# Patient Record
Sex: Female | Born: 1960 | Race: White | Hispanic: No | Marital: Married | Smoking: Never smoker
Health system: Southern US, Community
[De-identification: ages and names within clinical notes are randomized; demographics above are authoritative.]

## PROBLEM LIST (undated history)

## (undated) DIAGNOSIS — G610 Guillain-Barre syndrome: Secondary | ICD-10-CM

## (undated) DIAGNOSIS — N920 Excessive and frequent menstruation with regular cycle: Secondary | ICD-10-CM

## (undated) DIAGNOSIS — C801 Malignant (primary) neoplasm, unspecified: Secondary | ICD-10-CM

## (undated) HISTORY — PX: TUBAL LIGATION: SHX77

## (undated) HISTORY — PX: BASAL CELL CARCINOMA EXCISION: SHX1214

---

## 1998-12-10 ENCOUNTER — Other Ambulatory Visit: Admission: RE | Admit: 1998-12-10 | Discharge: 1998-12-10 | Payer: Self-pay | Admitting: Gynecology

## 1998-12-25 ENCOUNTER — Ambulatory Visit (HOSPITAL_COMMUNITY): Admission: RE | Admit: 1998-12-25 | Discharge: 1998-12-25 | Payer: Self-pay | Admitting: Gynecology

## 1999-08-30 ENCOUNTER — Emergency Department (HOSPITAL_COMMUNITY): Admission: EM | Admit: 1999-08-30 | Discharge: 1999-08-31 | Payer: Self-pay | Admitting: Emergency Medicine

## 2002-06-17 ENCOUNTER — Other Ambulatory Visit: Admission: RE | Admit: 2002-06-17 | Discharge: 2002-06-17 | Payer: Self-pay | Admitting: Gynecology

## 2002-06-25 ENCOUNTER — Encounter: Admission: RE | Admit: 2002-06-25 | Discharge: 2002-06-25 | Payer: Self-pay | Admitting: Gynecology

## 2002-06-25 ENCOUNTER — Encounter: Payer: Self-pay | Admitting: Gynecology

## 2003-06-30 ENCOUNTER — Other Ambulatory Visit: Admission: RE | Admit: 2003-06-30 | Discharge: 2003-06-30 | Payer: Self-pay | Admitting: Gynecology

## 2003-06-30 ENCOUNTER — Encounter: Admission: RE | Admit: 2003-06-30 | Discharge: 2003-06-30 | Payer: Self-pay | Admitting: Gynecology

## 2004-06-30 ENCOUNTER — Encounter: Admission: RE | Admit: 2004-06-30 | Discharge: 2004-06-30 | Payer: Self-pay | Admitting: Gynecology

## 2005-07-04 ENCOUNTER — Encounter: Admission: RE | Admit: 2005-07-04 | Discharge: 2005-07-04 | Payer: Self-pay | Admitting: Gynecology

## 2006-07-05 ENCOUNTER — Encounter: Admission: RE | Admit: 2006-07-05 | Discharge: 2006-07-05 | Payer: Self-pay | Admitting: Gynecology

## 2007-07-09 ENCOUNTER — Encounter: Admission: RE | Admit: 2007-07-09 | Discharge: 2007-07-09 | Payer: Self-pay | Admitting: Family Medicine

## 2008-07-09 ENCOUNTER — Encounter: Admission: RE | Admit: 2008-07-09 | Discharge: 2008-07-09 | Payer: Self-pay | Admitting: Family Medicine

## 2009-07-15 ENCOUNTER — Encounter: Admission: RE | Admit: 2009-07-15 | Discharge: 2009-07-15 | Payer: Self-pay | Admitting: Internal Medicine

## 2010-06-25 ENCOUNTER — Other Ambulatory Visit: Payer: Self-pay | Admitting: Family Medicine

## 2010-06-25 DIAGNOSIS — Z1231 Encounter for screening mammogram for malignant neoplasm of breast: Secondary | ICD-10-CM

## 2010-08-30 ENCOUNTER — Ambulatory Visit: Payer: Self-pay

## 2010-09-09 ENCOUNTER — Ambulatory Visit
Admission: RE | Admit: 2010-09-09 | Discharge: 2010-09-09 | Disposition: A | Discharge status: Home / Self Care | Payer: BC Managed Care – PPO | Source: Ambulatory Visit | Attending: Family Medicine | Admitting: Family Medicine

## 2010-09-09 DIAGNOSIS — Z1231 Encounter for screening mammogram for malignant neoplasm of breast: Secondary | ICD-10-CM

## 2011-01-25 ENCOUNTER — Other Ambulatory Visit: Payer: Self-pay | Admitting: Obstetrics and Gynecology

## 2011-02-03 ENCOUNTER — Encounter (HOSPITAL_COMMUNITY)
Admission: RE | Admit: 2011-02-03 | Discharge: 2011-02-03 | Disposition: A | Discharge status: Home / Self Care | Payer: BC Managed Care – PPO | Source: Ambulatory Visit | Attending: Obstetrics and Gynecology | Admitting: Obstetrics and Gynecology

## 2011-02-03 ENCOUNTER — Encounter (HOSPITAL_COMMUNITY): Payer: Self-pay

## 2011-02-03 HISTORY — DX: Malignant (primary) neoplasm, unspecified: C80.1

## 2011-02-03 LAB — CBC
Hemoglobin: 12.9 g/dL (ref 12.0–15.0)
MCH: 26 pg (ref 26.0–34.0)
MCHC: 33.2 g/dL (ref 30.0–36.0)
RBC: 4.96 MIL/uL (ref 3.87–5.11)

## 2011-02-03 LAB — BASIC METABOLIC PANEL
CO2: 30 mEq/L (ref 19–32)
Potassium: 4.4 mEq/L (ref 3.5–5.1)
Sodium: 137 mEq/L (ref 135–145)

## 2011-02-03 LAB — SURGICAL PCR SCREEN
MRSA, PCR: NEGATIVE
Staphylococcus aureus: NEGATIVE

## 2011-02-03 NOTE — Patient Instructions (Signed)
ELIABETH SHOFF  02/03/2011   Your procedure is scheduled on:  02/09/11  Report to [facility] at 0600 AM.  Call this number if you have problems the morning of surgery: [phone]  On the day of surgery, once you arrive to [facility] call ext. [phone] to inform us of your arrival.    Remember:   Do not eat food:After Midnight.  Do not drink clear liquids: After Midnight.  Take these medicines the morning of surgery with A SIP OF WATER: none   Do not wear jewelry, make-up or nail polish.  Do not bring valuables to the hospital.  Contacts, dentures or bridgework may not be worn into surgery.  Leave suitcase in the car. After surgery it may be brought to your room.  For patients admitted to the hospital, checkout time is 11:00 AM the day of discharge.   Patients discharged the day of surgery will not be allowed to drive home.  Name and phone number of your driver: Kathlene November- [phone]  Special Instructions:Hibiclens showers  Please read over the following fact sheets that you were given:none

## 2011-02-08 NOTE — H&P (Signed)
Pamela White is an 50 y.o. female. With menorrhagia and perimenopausal symptoms for definitive management.  D/W pt R/B/A of LAVH wishes to proceed  POBGYN HX G1P1 - LTCS 9#2, arrest of dilitation No abnormal pap No h/o STDs Menorrhagia, nl EMB, perimenopausal sx;s    Past Medical History  Diagnosis Date  . Cancer     basal cell removed from face    Past Surgical History  Procedure Date  . Tubal ligation   . Basal cell carcinoma excision   cesarean section Tonsillectomy  FH CAD, CVA, DM2, Ewings sarcoms, hypercholesterolemis, MI  Social History:  reports that she has never smoked. She does not have any smokeless tobacco history on file. She reports that she drinks alcohol. She reports that she does not use illicit drugs. married  Allergies: No Known Allergies  Meds: Aleve, B12, MVI    Review of Systems  Constitutional: Negative.   HENT: Negative.   Eyes: Negative.   Respiratory: Negative.   Cardiovascular: Negative.   Gastrointestinal: Negative.   Genitourinary: Negative.   Musculoskeletal: Negative.   Neurological: Negative.   Psychiatric/Behavioral: Negative.     AF VSS Physical Exam  Constitutional: She is oriented to person, place, and time. She appears well-developed and well-nourished.  HENT:  Head: Normocephalic and atraumatic.  Neck: Normal range of motion. Neck supple.  Cardiovascular: Normal rate and regular rhythm.   Respiratory: Effort normal and breath sounds normal.  GI: Soft. Bowel sounds are normal. There is no tenderness.  Musculoskeletal: Normal range of motion.  Neurological: She is alert and oriented to person, place, and time.  Psychiatric: She has a normal mood and affect.    Assessment/Plan: 50yo G1P1 with menorrhagia desires definitive management.  Had considered Novasure Endometrial Ablation.  Have discussed with pt R/B/A of LAVH including risk of bleeding, infection, damage to surrounding tissue. She wishes to proceed.  Surgery  scheduled for 02/09/11.  BOVARD,Alexzia Kasler 02/08/2011, 12:31 PM

## 2011-02-09 ENCOUNTER — Other Ambulatory Visit: Payer: Self-pay | Admitting: Obstetrics and Gynecology

## 2011-02-09 ENCOUNTER — Encounter (HOSPITAL_COMMUNITY)
Admission: RE | Disposition: A | Discharge status: Hospice | Payer: Self-pay | Source: Ambulatory Visit | Attending: Obstetrics and Gynecology

## 2011-02-09 ENCOUNTER — Encounter (HOSPITAL_COMMUNITY): Payer: Self-pay | Admitting: Obstetrics and Gynecology

## 2011-02-09 ENCOUNTER — Encounter (HOSPITAL_COMMUNITY): Payer: Self-pay | Admitting: Anesthesiology

## 2011-02-09 ENCOUNTER — Inpatient Hospital Stay (HOSPITAL_COMMUNITY)
Admission: RE | Admit: 2011-02-09 | Discharge: 2011-02-10 | DRG: 361 | Disposition: A | Discharge status: Hospice | Payer: BC Managed Care – PPO | Source: Ambulatory Visit | Attending: Obstetrics and Gynecology | Admitting: Obstetrics and Gynecology

## 2011-02-09 ENCOUNTER — Inpatient Hospital Stay (HOSPITAL_COMMUNITY): Payer: BC Managed Care – PPO | Admitting: Anesthesiology

## 2011-02-09 DIAGNOSIS — D251 Intramural leiomyoma of uterus: Secondary | ICD-10-CM | POA: Diagnosis present

## 2011-02-09 DIAGNOSIS — Z01812 Encounter for preprocedural laboratory examination: Secondary | ICD-10-CM

## 2011-02-09 DIAGNOSIS — D252 Subserosal leiomyoma of uterus: Secondary | ICD-10-CM | POA: Diagnosis present

## 2011-02-09 DIAGNOSIS — N8000 Endometriosis of the uterus, unspecified: Secondary | ICD-10-CM | POA: Diagnosis present

## 2011-02-09 DIAGNOSIS — N920 Excessive and frequent menstruation with regular cycle: Principal | ICD-10-CM | POA: Diagnosis present

## 2011-02-09 DIAGNOSIS — G610 Guillain-Barre syndrome: Secondary | ICD-10-CM | POA: Insufficient documentation

## 2011-02-09 DIAGNOSIS — Z01818 Encounter for other preprocedural examination: Secondary | ICD-10-CM

## 2011-02-09 HISTORY — DX: Guillain-Barre syndrome: G61.0

## 2011-02-09 HISTORY — DX: Excessive and frequent menstruation with regular cycle: N92.0

## 2011-02-09 HISTORY — PX: LAPAROSCOPIC ASSISTED VAGINAL HYSTERECTOMY: SHX5398

## 2011-02-09 SURGERY — HYSTERECTOMY, VAGINAL, LAPAROSCOPY-ASSISTED
Anesthesia: General | Site: Abdomen | Wound class: Clean Contaminated

## 2011-02-09 MED ORDER — OXYCODONE-ACETAMINOPHEN 5-325 MG PO TABS: 1.0000 | ORAL_TABLET | ORAL | Status: DC | PRN

## 2011-02-09 MED ORDER — SODIUM CHLORIDE 0.9 % IV SOLN: 250.0000 mL | INTRAVENOUS | Status: DC

## 2011-02-09 MED ORDER — ROCURONIUM BROMIDE 100 MG/10ML IV SOLN
INTRAVENOUS | Status: DC | PRN
  Administered 2011-02-09: 50 mg via INTRAVENOUS

## 2011-02-09 MED ORDER — MORPHINE SULFATE (PF) 1 MG/ML IV SOLN
INTRAVENOUS | Status: DC
  Administered 2011-02-09: 25 mg via INTRAVENOUS
  Administered 2011-02-09 (×2): 1 mg via INTRAVENOUS
  Filled 2011-02-09: qty 25

## 2011-02-09 MED ORDER — ROCURONIUM BROMIDE 50 MG/5ML IV SOLN
INTRAVENOUS | Status: AC
  Filled 2011-02-09: qty 1

## 2011-02-09 MED ORDER — MENTHOL 3 MG MT LOZG: 1.0000 | LOZENGE | OROMUCOSAL | Status: DC | PRN

## 2011-02-09 MED ORDER — LIDOCAINE HCL (CARDIAC) 20 MG/ML IV SOLN
INTRAVENOUS | Status: DC | PRN
  Administered 2011-02-09: 100 mg via INTRAVENOUS

## 2011-02-09 MED ORDER — PROPOFOL 10 MG/ML IV EMUL
INTRAVENOUS | Status: AC
  Filled 2011-02-09: qty 20

## 2011-02-09 MED ORDER — MIDAZOLAM HCL 2 MG/2ML IJ SOLN
INTRAMUSCULAR | Status: AC
  Filled 2011-02-09: qty 2

## 2011-02-09 MED ORDER — HYDROMORPHONE HCL 2 MG PO TABS
2.0000 mg | ORAL_TABLET | ORAL | Status: DC | PRN
  Administered 2011-02-09 – 2011-02-10 (×4): 2 mg via ORAL
  Filled 2011-02-09 (×4): qty 1

## 2011-02-09 MED ORDER — LACTATED RINGERS IR SOLN
Status: DC | PRN
  Administered 2011-02-09: 3000 mL

## 2011-02-09 MED ORDER — SODIUM CHLORIDE 0.9 % IJ SOLN: 9.0000 mL | INTRAMUSCULAR | Status: DC | PRN

## 2011-02-09 MED ORDER — NEOSTIGMINE METHYLSULFATE 1 MG/ML IJ SOLN
INTRAMUSCULAR | Status: DC | PRN
  Administered 2011-02-09: 1 mg via INTRAMUSCULAR

## 2011-02-09 MED ORDER — LIDOCAINE HCL (CARDIAC) 20 MG/ML IV SOLN
INTRAVENOUS | Status: AC
  Filled 2011-02-09: qty 5

## 2011-02-09 MED ORDER — NEOSTIGMINE METHYLSULFATE 1 MG/ML IJ SOLN
INTRAMUSCULAR | Status: AC
  Filled 2011-02-09: qty 10

## 2011-02-09 MED ORDER — SODIUM CHLORIDE 0.9 % IJ SOLN: 3.0000 mL | INTRAMUSCULAR | Status: DC | PRN

## 2011-02-09 MED ORDER — SODIUM CHLORIDE 0.9 % IJ SOLN: 3.0000 mL | Freq: Two times a day (BID) | INTRAMUSCULAR | Status: DC

## 2011-02-09 MED ORDER — ONDANSETRON HCL 4 MG/2ML IJ SOLN
4.0000 mg | Freq: Four times a day (QID) | INTRAMUSCULAR | Status: DC | PRN
  Filled 2011-02-09: qty 2

## 2011-02-09 MED ORDER — MORPHINE SULFATE 10 MG/ML IJ SOLN
INTRAMUSCULAR | Status: DC | PRN
  Administered 2011-02-09 (×2): 2.5 mg via INTRAVENOUS
  Administered 2011-02-09: 5 mg via INTRAVENOUS

## 2011-02-09 MED ORDER — THERA M PLUS PO TABS
1.0000 | ORAL_TABLET | Freq: Every day | ORAL | Status: DC
  Administered 2011-02-10: 1 via ORAL
  Filled 2011-02-09 (×3): qty 1

## 2011-02-09 MED ORDER — LACTATED RINGERS IV SOLN
INTRAVENOUS | Status: DC | PRN
  Administered 2011-02-09 (×3): via INTRAVENOUS

## 2011-02-09 MED ORDER — HYDROMORPHONE HCL 1 MG/ML IJ SOLN
0.2500 mg | INTRAMUSCULAR | Status: DC | PRN
  Administered 2011-02-09 (×2): 0.5 mg via INTRAVENOUS

## 2011-02-09 MED ORDER — GLYCOPYRROLATE 0.2 MG/ML IJ SOLN
INTRAMUSCULAR | Status: AC
  Filled 2011-02-09: qty 1

## 2011-02-09 MED ORDER — ONDANSETRON HCL 4 MG/2ML IJ SOLN
INTRAMUSCULAR | Status: AC
  Filled 2011-02-09: qty 2

## 2011-02-09 MED ORDER — ONDANSETRON HCL 4 MG PO TABS: 4.0000 mg | ORAL_TABLET | Freq: Four times a day (QID) | ORAL | Status: DC | PRN

## 2011-02-09 MED ORDER — FENTANYL CITRATE 0.05 MG/ML IJ SOLN
INTRAMUSCULAR | Status: AC
  Filled 2011-02-09: qty 5

## 2011-02-09 MED ORDER — LACTATED RINGERS IV SOLN
INTRAVENOUS | Status: DC
  Administered 2011-02-09: 06:00:00 via INTRAVENOUS

## 2011-02-09 MED ORDER — PROPOFOL 10 MG/ML IV EMUL
INTRAVENOUS | Status: DC | PRN
  Administered 2011-02-09: 200 mg via INTRAVENOUS

## 2011-02-09 MED ORDER — DIPHENHYDRAMINE HCL 12.5 MG/5ML PO ELIX
12.5000 mg | ORAL_SOLUTION | Freq: Four times a day (QID) | ORAL | Status: DC | PRN
  Filled 2011-02-09: qty 5

## 2011-02-09 MED ORDER — ONE-DAILY MULTI VITAMINS PO TABS: 1.0000 | ORAL_TABLET | Freq: Every day | ORAL | Status: DC

## 2011-02-09 MED ORDER — ZOLPIDEM TARTRATE 5 MG PO TABS: 5.0000 mg | ORAL_TABLET | Freq: Every evening | ORAL | Status: DC | PRN

## 2011-02-09 MED ORDER — ALUM & MAG HYDROXIDE-SIMETH 200-200-20 MG/5ML PO SUSP: 30.0000 mL | ORAL | Status: DC | PRN

## 2011-02-09 MED ORDER — DIPHENHYDRAMINE HCL 50 MG/ML IJ SOLN: 12.5000 mg | Freq: Four times a day (QID) | INTRAMUSCULAR | Status: DC | PRN

## 2011-02-09 MED ORDER — DEXAMETHASONE SODIUM PHOSPHATE 10 MG/ML IJ SOLN
INTRAMUSCULAR | Status: DC | PRN
Start: 2011-02-09 — End: 2011-02-09
  Administered 2011-02-09: 10 mg via INTRAVENOUS

## 2011-02-09 MED ORDER — NALOXONE HCL 0.4 MG/ML IJ SOLN: 0.4000 mg | INTRAMUSCULAR | Status: DC | PRN

## 2011-02-09 MED ORDER — VITAMIN B-12 1000 MCG PO TABS
2000.0000 ug | ORAL_TABLET | Freq: Every day | ORAL | Status: DC
  Administered 2011-02-10: 2000 ug via ORAL
  Filled 2011-02-09 (×2): qty 2

## 2011-02-09 MED ORDER — SIMETHICONE 80 MG PO CHEW: 80.0000 mg | CHEWABLE_TABLET | Freq: Four times a day (QID) | ORAL | Status: DC | PRN

## 2011-02-09 MED ORDER — FENTANYL CITRATE 0.05 MG/ML IJ SOLN
INTRAMUSCULAR | Status: DC | PRN
  Administered 2011-02-09: 150 ug via INTRAVENOUS
  Administered 2011-02-09: 50 ug via INTRAVENOUS

## 2011-02-09 MED ORDER — CEFAZOLIN SODIUM 1-5 GM-% IV SOLN
1.0000 g | INTRAVENOUS | Status: AC
  Administered 2011-02-09: 1 g via INTRAVENOUS

## 2011-02-09 MED ORDER — IBUPROFEN 800 MG PO TABS
800.0000 mg | ORAL_TABLET | Freq: Three times a day (TID) | ORAL | Status: DC | PRN
  Administered 2011-02-09: 800 mg via ORAL
  Filled 2011-02-09: qty 1

## 2011-02-09 MED ORDER — CEFAZOLIN SODIUM 1-5 GM-% IV SOLN
INTRAVENOUS | Status: AC
  Filled 2011-02-09: qty 50

## 2011-02-09 MED ORDER — LACTATED RINGERS IV SOLN
INTRAVENOUS | Status: DC
  Administered 2011-02-09 – 2011-02-10 (×3): via INTRAVENOUS

## 2011-02-09 MED ORDER — DEXAMETHASONE SODIUM PHOSPHATE 10 MG/ML IJ SOLN
INTRAMUSCULAR | Status: AC
  Filled 2011-02-09: qty 1

## 2011-02-09 MED ORDER — BUPIVACAINE HCL (PF) 0.25 % IJ SOLN
INTRAMUSCULAR | Status: DC | PRN
  Administered 2011-02-09: 10 mL

## 2011-02-09 MED ORDER — HYDROMORPHONE HCL 1 MG/ML IJ SOLN
INTRAMUSCULAR | Status: AC
  Administered 2011-02-09: 0.5 mg via INTRAVENOUS
  Filled 2011-02-09: qty 1

## 2011-02-09 MED ORDER — METOCLOPRAMIDE HCL 5 MG/ML IJ SOLN: 10.0000 mg | Freq: Once | INTRAMUSCULAR | Status: DC | PRN

## 2011-02-09 MED ORDER — GLYCOPYRROLATE 0.2 MG/ML IJ SOLN
INTRAMUSCULAR | Status: DC | PRN
  Administered 2011-02-09: 0.2 mg via INTRAVENOUS

## 2011-02-09 MED ORDER — ONDANSETRON HCL 4 MG/2ML IJ SOLN
INTRAMUSCULAR | Status: DC | PRN
  Administered 2011-02-09: 4 mg via INTRAVENOUS

## 2011-02-09 MED ORDER — MIDAZOLAM HCL 5 MG/5ML IJ SOLN
INTRAMUSCULAR | Status: DC | PRN
  Administered 2011-02-09: 2 mg via INTRAVENOUS

## 2011-02-09 MED ORDER — ONDANSETRON HCL 4 MG/2ML IJ SOLN
4.0000 mg | Freq: Four times a day (QID) | INTRAMUSCULAR | Status: DC | PRN
  Administered 2011-02-09: 4 mg via INTRAVENOUS

## 2011-02-09 MED ORDER — GUAIFENESIN 100 MG/5ML PO SOLN: 15.0000 mL | ORAL | Status: DC | PRN

## 2011-02-09 MED ORDER — DOCUSATE SODIUM 100 MG PO CAPS
100.0000 mg | ORAL_CAPSULE | Freq: Every day | ORAL | Status: DC
  Administered 2011-02-10: 100 mg via ORAL
  Filled 2011-02-09: qty 1

## 2011-02-09 MED ORDER — MORPHINE SULFATE 10 MG/ML IJ SOLN
INTRAMUSCULAR | Status: AC
  Filled 2011-02-09: qty 1

## 2011-02-09 SURGICAL SUPPLY — 41 items
BANDAID FLEXIBLE 1X3 (GAUZE/BANDAGES/DRESSINGS) ×6 IMPLANT
BENZOIN TINCTURE PRP APPL 2/3 (GAUZE/BANDAGES/DRESSINGS) ×6 IMPLANT
CABLE HIGH FREQUENCY MONO STRZ (ELECTRODE) IMPLANT
CATH ROBINSON RED A/P 16FR (CATHETERS) IMPLANT
CHLORAPREP W/TINT 26ML (MISCELLANEOUS) ×2 IMPLANT
CLOTH BEACON ORANGE TIMEOUT ST (SAFETY) ×2 IMPLANT
CONT PATH 16OZ SNAP LID 3702 (MISCELLANEOUS) ×2 IMPLANT
COVER LIGHT HANDLE  1/PK (MISCELLANEOUS) ×1
COVER LIGHT HANDLE 1/PK (MISCELLANEOUS) ×1 IMPLANT
COVER TABLE BACK 60X90 (DRAPES) ×2 IMPLANT
DECANTER SPIKE VIAL GLASS SM (MISCELLANEOUS) ×2 IMPLANT
DRAPE CAMERA CLOSED 9X96 (DRAPES) ×2 IMPLANT
DRAPE UTILITY XL STRL (DRAPES) ×2 IMPLANT
ELECT REM PT RETURN 9FT ADLT (ELECTROSURGICAL) ×2
ELECTRODE REM PT RTRN 9FT ADLT (ELECTROSURGICAL) ×1 IMPLANT
EVACUATOR PREFILTER SMOKE (MISCELLANEOUS) ×2 IMPLANT
GAUZE SPONGE 4X4 16PLY XRAY LF (GAUZE/BANDAGES/DRESSINGS) ×2 IMPLANT
GLOVE BIO SURGEON STRL SZ 6.5 (GLOVE) ×4 IMPLANT
GLOVE BIO SURGEON STRL SZ7 (GLOVE) ×2 IMPLANT
GOWN PREVENTION PLUS LG XLONG (DISPOSABLE) ×8 IMPLANT
NEEDLE INSUFFLATION 14GA 120MM (NEEDLE) ×2 IMPLANT
NS IRRIG 1000ML POUR BTL (IV SOLUTION) ×2 IMPLANT
PACK LAVH (CUSTOM PROCEDURE TRAY) ×2 IMPLANT
SCALPEL HARMONIC ACE (MISCELLANEOUS) ×2 IMPLANT
SET CYSTO W/LG BORE CLAMP LF (SET/KITS/TRAYS/PACK) IMPLANT
SET IRRIG TUBING LAPAROSCOPIC (IRRIGATION / IRRIGATOR) ×2 IMPLANT
SOLUTION ELECTROLUBE (MISCELLANEOUS) IMPLANT
STRIP CLOSURE SKIN 1/4X3 (GAUZE/BANDAGES/DRESSINGS) IMPLANT
STRIP CLOSURE SKIN 1X5 (GAUZE/BANDAGES/DRESSINGS) ×6 IMPLANT
SUT SILK 0 FSL (SUTURE) ×2 IMPLANT
SUT VIC AB 1 CT1 18XBRD ANBCTR (SUTURE) ×2 IMPLANT
SUT VIC AB 1 CT1 8-18 (SUTURE) ×2
SUT VIC AB 2-0 CT1 (SUTURE) ×4 IMPLANT
SUT VICRYL 0 TIES 12 18 (SUTURE) ×2 IMPLANT
SUT VICRYL 0 UR6 27IN ABS (SUTURE) ×4 IMPLANT
SUT VICRYL 4-0 PS2 18IN ABS (SUTURE) ×2 IMPLANT
TOWEL OR 17X24 6PK STRL BLUE (TOWEL DISPOSABLE) ×4 IMPLANT
TRAY FOLEY CATH 14FR (SET/KITS/TRAYS/PACK) ×2 IMPLANT
TROCAR XCEL NON-BLD 5MMX100MML (ENDOMECHANICALS) ×6 IMPLANT
WARMER LAPAROSCOPE (MISCELLANEOUS) ×2 IMPLANT
WATER STERILE IRR 1000ML POUR (IV SOLUTION) IMPLANT

## 2011-02-09 NOTE — Progress Notes (Signed)
Day of Surgery Procedure(s): LAPAROSCOPIC ASSISTED VAGINAL HYSTERECTOMY  Subjective: Patient reports incisional pain and "heavy feeling" in pelvis.  Some nausea - feels like is PCA, wants to eat and try po pain meds.    Objective: I have reviewed patient's vital signs.  General: alert and no distress Resp: clear to auscultation bilaterally Cardio: regular rate and rhythm GI: soft, non-tender; bowel sounds normal; no masses,  no organomegaly Extremities: extremities normal, atraumatic, no cyanosis or edema Inc C/D/I  Assessment: s/p Procedure(s): LAPAROSCOPIC ASSISTED VAGINAL HYSTERECTOMY: stable  Plan: Encourage ambulation routine care, likely d/c in AM  LOS: 0 days    White,Pamela Blassingame 02/09/2011, 5:44 PM

## 2011-02-09 NOTE — Anesthesia Postprocedure Evaluation (Signed)
  Anesthesia Post-op Note  Patient: Pamela White  Procedure(s) Performed:  LAPAROSCOPIC ASSISTED VAGINAL HYSTERECTOMY  Patient Location: PACU  Anesthesia Type: General  Level of Consciousness: awake, alert  and oriented  Airway and Oxygen Therapy: Patient Spontanous Breathing  Post-op Pain: none  Post-op Assessment: Post-op Vital signs reviewed, Patient's Cardiovascular Status Stable, Respiratory Function Stable, Patent Airway, No signs of Nausea or vomiting and Pain level controlled  Post-op Vital Signs: Reviewed and stable  Complications: No apparent anesthesia complications

## 2011-02-09 NOTE — Op Note (Signed)
[name], [name]                ACCOUNT NO.:  [id number]  MEDICAL RECORD NO.:  [id number]  LOCATION:  [facility]                          FACILITY:  [facility]  PHYSICIAN:  Sherron Monday, MD        DATE OF BIRTH:  1960/10/09  DATE OF PROCEDURE:  02/09/2011 DATE OF DISCHARGE:                              OPERATIVE REPORT   PREOPERATIVE DIAGNOSES:  Menorrhagia and fibroids.  POSTOPERATIVE DIAGNOSES:  Menorrhagia and fibroids.  PROCEDURE:  Laparoscopic-assisted vaginal hysterectomy.  SURGEON:  Sherron Monday, MD  ASSISTANT:  Huel Cote, MD  ANESTHESIA:  10 mL of 0.25% Marcaine as well as a general anesthetic.  SPECIMEN:  The uterus and cervix to Pathology.  ESTIMATED BLOOD LOSS:  300 mL.  IV FLUIDS:  2000 mL.  URINE OUTPUT:  300 mL of clear urine output.  FINDINGS:  Mildly enlarged minimally fibroid uterus noted in normal tubes and ovaries, left ovary with a functional cyst.  COMPLICATIONS:  None.  DISPOSITION:  Stable to PACU.  PROCEDURE:  After informed consent was reviewed with the patient including the risks, benefits, and alternatives of surgical procedure, she was transported to the operating room and placed on the table in supine position.  General anesthesia was induced and found to be adequate.  Then, she was prepped and draped in a normal sterile fashion. After the vaginal instruments were placed using an open-sided speculum, the cervix was easily visualized.  Hulka tenaculum was placed and a Foley was placed in her bladder sterilely, gloves were changed. Attention was turned to the abdominal portion of the case and approximately 5-mm infraumbilical incision was made using a Veress needle.  Pneumoperitoneum was obtained after checking with the hanging drop test to be sure that we were in the peritoneum.  The peritoneum was entered under direct visualization.  Accessory ports were placed on both the left and right after carefully looking for the epigastric  vessels. The procedure was then started with a harmonic scalpel.  The Uteroovarian, cardinal ligaments, and mesosalpinx were separated using the harmonic scalpel. A bladder flap was created, this was done bilaterally.  Pedicles were noted to be hemostatic.  It was noted that the patient had previously had a tubal ligation.  Attention was then turned to perform the rest of the procedure vaginally.  Heavy-weighted speculum and Sims retractor were used to visualize the cervix.  Cervix was circumscribed with Bovie cautery and entered in the posterior cul-de-sac.  The uterosacral ligaments were plicated and held in a stepwise fashion and the other ligaments and mesosalpinx were plicated.  The uterus was delivered in it's entirety.  The area of continued bleeding was stopped with figure- of-eight suture.  The pedicles were otherwise inspected and found to be hemostatic.  The uterosacral ligaments were plicated.  The vagina was closed with 2-0 Vicryl in a running fashion.  Attention was then turned to another look from the abdomen and gloves were changed.  Copious pelvic irrigation was performed.  Hemostasis was assured.  The trocars were removed under direct visualization.  The skin was closed with 4-0 Vicryl.  The patient tolerated the procedure well.  Sponge, lap, and needle count were correct x2  at the end of the procedure.     Sherron Monday, MD     JB/MEDQ  D:  02/09/2011  T:  02/09/2011  Job:  [id number]

## 2011-02-09 NOTE — Progress Notes (Signed)
Encounter addended by: Pat Patrick on: 02/09/2011  3:57 PM<BR>     Documentation filed: Notes Section

## 2011-02-09 NOTE — Anesthesia Postprocedure Evaluation (Signed)
  Anesthesia Post-op Note  Patient: Pamela White  Procedure(s) Performed:  LAPAROSCOPIC ASSISTED VAGINAL HYSTERECTOMY  Patient Location: PACU and Women's Unit  Anesthesia Type: General  Level of Consciousness: awake, alert  and oriented  Airway and Oxygen Therapy: Patient Spontanous Breathing and Patient connected to nasal cannula oxygen  Post-op Pain: none  Post-op Assessment: Post-op Vital signs reviewed and Patient's Cardiovascular Status Stable  Post-op Vital Signs: Reviewed and stable  Complications: No apparent anesthesia complications

## 2011-02-09 NOTE — Preoperative (Signed)
Beta Blockers   Reason not to administer Beta Blockers:Not Applicable 

## 2011-02-09 NOTE — Anesthesia Preprocedure Evaluation (Addendum)
Anesthesia Evaluation  Name, MR# and DOB Patient awake  General Assessment Comment  Reviewed: Allergy & Precautions, H&P , NPO status , Patient's Chart, lab work & pertinent test results, reviewed documented beta blocker date and time   History of Anesthesia Complications Negative for: history of anesthetic complications  Airway Mallampati: II TM Distance: >3 FB Neck ROM: full    Dental  (+) Teeth Intact   Pulmonary  clear to auscultation  breath sounds clear to auscultation none    Cardiovascular Exercise Tolerance: Good regular Normal    Neuro/Psych Negative Neurological ROS  Negative Psych ROS  GI/Hepatic/Renal negative GI ROS  negative Liver ROS  negative Renal ROS        Endo/Other  Negative Endocrine ROS (+)      Abdominal   Musculoskeletal   Hematology negative hematology ROS (+)   Peds  Reproductive/Obstetrics    Anesthesia Other Findings            Anesthesia Physical Anesthesia Plan  ASA: II  Anesthesia Plan: General   Post-op Pain Management:    Induction:   Airway Management Planned:   Additional Equipment:   Intra-op Plan:   Post-operative Plan:   Informed Consent: I have reviewed the patients History and Physical, chart, labs and discussed the procedure including the risks, benefits and alternatives for the proposed anesthesia with the patient or authorized representative who has indicated his/her understanding and acceptance.   Dental Advisory Given  Plan Discussed with: CRNA and Surgeon  Anesthesia Plan Comments:         Anesthesia Quick Evaluation

## 2011-02-09 NOTE — Transfer of Care (Signed)
Immediate Anesthesia Transfer of Care Note  Patient: Pamela White  Procedure(s) Performed:  LAPAROSCOPIC ASSISTED VAGINAL HYSTERECTOMY  Patient Location: PACU  Anesthesia Type: General  Level of Consciousness: awake, alert , oriented and patient cooperative  Airway & Oxygen Therapy: Patient Spontanous Breathing and Patient connected to nasal cannula oxygen  Post-op Assessment: Report given to PACU RN and Post -op Vital signs reviewed and stable  Post vital signs: Reviewed  Complications: No apparent anesthesia complications

## 2011-02-09 NOTE — Anesthesia Procedure Notes (Addendum)
Procedure Name: Intubation Date/Time: 02/09/2011 7:32 AM Performed by: Doreene Burke Pre-anesthesia Checklist: Patient identified, Emergency Drugs available, Suction available and Patient being monitored Patient Re-evaluated:Patient Re-evaluated prior to inductionOxygen Delivery Method: Circle System Utilized Preoxygenation: Pre-oxygenation with 100% oxygen Intubation Type: IV induction and Circoid Pressure applied Ventilation: Mask ventilation without difficulty Grade View: Grade II Tube type: Oral Tube size: 7.0 mm Number of attempts: 2 Airway Equipment and Method: stylet and video-laryngoscopy Placement Confirmation: ETT inserted through vocal cords under direct vision,  positive ETCO2 and breath sounds checked- equal and bilateral Secured at: 21 cm Tube secured with: Tape Dental Injury: Teeth and Oropharynx as per pre-operative assessment  Difficulty Due To: Difficulty was unanticipated and Difficult Airway- due to anterior larynx Future Recommendations: Recommend- induction with short-acting agent, and alternative techniques readily available

## 2011-02-09 NOTE — Brief Op Note (Signed)
02/09/2011  10:09 AM  PATIENT:  Pamela White  50 y.o. female  PRE-OPERATIVE DIAGNOSIS:  menorrhagia fibroids  POST-OPERATIVE DIAGNOSIS:  menorrhagia; fibroids  PROCEDURE:  Procedure(s): LAPAROSCOPIC ASSISTED VAGINAL HYSTERECTOMY  SURGEON:  Surgeon(s): Sherron Monday, MD Oliver Pila   ANESTHESIA:   local and general  OR FLUID I/O:  Total I/O In: 2000 [I.V.:2000] Out: 600 [Urine:300; Blood:300]  BLOOD ADMINISTERED:none  DRAINS: none   LOCAL MEDICATIONS USED:  MARCAINE .25% 10CC  SPECIMEN:  Source of Specimen:  Uterus and cervix  DISPOSITION OF SPECIMEN:  PATHOLOGY  COUNTS:  YES  TOURNIQUET:  * No tourniquets in log *  DICTATION: .Other Dictation: Dictation Number [id number]  PLAN OF CARE: Admit for overnight observation  PATIENT DISPOSITION:  PACU - hemodynamically stable.   Delay start of Pharmacological VTE agent (>24hrs) due to surgical blood loss or risk of bleeding:  not applicable  EBL 300cc  IVF 2000cc uop 300cc, clear

## 2011-02-10 LAB — CBC
MCH: 26.2 pg (ref 26.0–34.0)
Platelets: 364 10*3/uL (ref 150–400)
RBC: 4.32 MIL/uL (ref 3.87–5.11)
RDW: 17.9 % — ABNORMAL HIGH (ref 11.5–15.5)

## 2011-02-10 LAB — BASIC METABOLIC PANEL
Calcium: 9 mg/dL (ref 8.4–10.5)
GFR calc non Af Amer: >60 mL/min (ref >60)
Glucose, Bld: 143 mg/dL — ABNORMAL HIGH (ref 70–99)
Sodium: 139 mEq/L (ref 135–145)

## 2011-02-10 MED ORDER — IBUPROFEN 800 MG PO TABS: 800.0000 mg | ORAL_TABLET | Freq: Three times a day (TID) | ORAL | Status: AC | PRN

## 2011-02-10 MED ORDER — HYDROMORPHONE HCL 2 MG PO TABS: 2.0000 mg | ORAL_TABLET | ORAL | Status: AC | PRN

## 2011-02-10 MED ORDER — OXYCODONE-ACETAMINOPHEN 5-325 MG PO TABS: 1.0000 | ORAL_TABLET | ORAL | Status: DC | PRN

## 2011-02-10 NOTE — Progress Notes (Signed)
Pt discharged home with husband... Condition stable... No equipment... Ambulated to car with Pricilla Larsson, RN.

## 2011-02-10 NOTE — Discharge Summary (Signed)
Physician Discharge Summary  Patient ID: Pamela White MRN: [id number] DOB/AGE: 1960/08/27 50 y.o.  Admit date: 02/09/2011 Discharge date: 02/10/2011  Admission Diagnoses:menorrhagia  Discharge Diagnoses:  Principal Problem:  *Menorrhagia s/p LAVH  Discharged Condition: stable  Hospital Course: uncomplicated   Treatments: IV hydration, analgesia: Dilaudid and Morphine and surgery: LAVH  Discharge Exam: Blood pressure 100/62, pulse 89, temperature 97.6 F (36.4 C), temperature source Oral, resp. rate 18, height 5\' 6"  (1.676 m), weight 96.163 kg (212 lb), SpO2 95.00%. General appearance: alert and no distress Resp: clear to auscultation bilaterally Extremities: extremities normal, atraumatic, no cyanosis or edema Incision/Wound: Abd soft, NT, ND, +BS CV RRR  Disposition: Final discharge disposition not confirmed  Discharge Orders    Future Orders Please Complete By Expires   Diet - low sodium heart healthy      Increase activity slowly      Discharge instructions      Comments:   Call [phone] with questions or problems   May walk up steps      May shower / Bathe      Driving Restrictions      Comments:   While on strong pain medicine   Lifting restrictions      Comments:   No greater than 20-25lbs   Sexual Activity Restrictions      Comments:   Pelvic rest - no douching, no tampons, for 6 weeks   Call MD for:  temperature >100.4      Call MD for:  persistant nausea and vomiting      Call MD for:  severe uncontrolled pain      Call MD for:  redness, tenderness, or signs of infection (pain, swelling, redness, odor or green/yellow discharge around incision site)        Current Discharge Medication List    START taking these medications   Details  ibuprofen (ADVIL,MOTRIN) 800 MG tablet Take 1 tablet (800 mg total) by mouth every 8 (eight) hours as needed for pain (mild pain). Qty: 40 tablet, Refills: 1    oxyCODONE-acetaminophen (ROXICET) 5-325 MG per tablet  Take 1 tablet by mouth every 4 (four) hours as needed for pain. Qty: 30 tablet, Refills: 0      CONTINUE these medications which have NOT CHANGED   Details  cyanocobalamin 2000 MCG tablet Take 2,000 mcg by mouth daily.      Multiple Vitamin (MULTIVITAMIN) tablet Take 1 tablet by mouth daily.      naproxen sodium (ALEVE) 220 MG tablet Take 440 mg by mouth daily.         Follow-up Information    Make an appointment with BOVARD,Everrett Lacasse, MD.   Contact information:   [address] [facility] [address] [place] [address] [phone]          Signed: BOVARD,Flay Ghosh 02/10/2011, 8:45 AM

## 2011-02-10 NOTE — Progress Notes (Signed)
1 Day Post-Op Procedure(s): LAPAROSCOPIC ASSISTED VAGINAL HYSTERECTOMY  Subjective: Patient reports incisional pain, tolerating PO and no problems voiding.   Cr .76-.66 - stable, Hgb 12.9-11.3  Objective: I have reviewed patient's vital signs, intake and output and labs.  General: alert and no distress Resp: clear to auscultation bilaterally Cardio: regular rate and rhythm GI: soft, non-tender; bowel sounds normal; no masses,  no organomegaly and incision: clean, dry and intact Extremities: extremities normal, atraumatic, no cyanosis or edema Vaginal Bleeding: minimal  Assessment: s/p Procedure(s): LAPAROSCOPIC ASSISTED VAGINAL HYSTERECTOMY: stable  Plan: Advance diet Encourage ambulation Advance to PO medication Discontinue IV fluids Discharge home when meets above goals.    LOS: 1 day    Pamela White,Pamela White 02/10/2011, 8:30 AM

## 2011-02-10 NOTE — Progress Notes (Signed)
UR Chart review completed.  

## 2011-02-14 ENCOUNTER — Encounter (HOSPITAL_COMMUNITY): Payer: Self-pay | Admitting: Obstetrics and Gynecology

## 2011-09-12 ENCOUNTER — Other Ambulatory Visit: Payer: Self-pay | Admitting: Obstetrics and Gynecology

## 2011-09-12 ENCOUNTER — Other Ambulatory Visit: Payer: Self-pay | Admitting: Family Medicine

## 2011-09-12 DIAGNOSIS — Z1231 Encounter for screening mammogram for malignant neoplasm of breast: Secondary | ICD-10-CM

## 2011-10-17 ENCOUNTER — Ambulatory Visit
Admission: RE | Admit: 2011-10-17 | Discharge: 2011-10-17 | Disposition: A | Discharge status: Home / Self Care | Payer: BC Managed Care – PPO | Source: Ambulatory Visit | Attending: Obstetrics and Gynecology | Admitting: Obstetrics and Gynecology

## 2011-10-17 DIAGNOSIS — Z1231 Encounter for screening mammogram for malignant neoplasm of breast: Secondary | ICD-10-CM

## 2012-09-13 ENCOUNTER — Other Ambulatory Visit: Payer: Self-pay

## 2012-09-13 DIAGNOSIS — Z1231 Encounter for screening mammogram for malignant neoplasm of breast: Secondary | ICD-10-CM

## 2012-10-17 ENCOUNTER — Ambulatory Visit
Admission: RE | Admit: 2012-10-17 | Discharge: 2012-10-17 | Disposition: A | Discharge status: Home / Self Care | Payer: Managed Care, Other (non HMO) | Source: Ambulatory Visit

## 2012-10-17 ENCOUNTER — Ambulatory Visit: Payer: BC Managed Care – PPO

## 2012-10-17 DIAGNOSIS — Z1231 Encounter for screening mammogram for malignant neoplasm of breast: Secondary | ICD-10-CM

## 2013-09-09 ENCOUNTER — Other Ambulatory Visit: Payer: Self-pay

## 2013-09-09 DIAGNOSIS — Z1231 Encounter for screening mammogram for malignant neoplasm of breast: Secondary | ICD-10-CM

## 2013-10-03 ENCOUNTER — Other Ambulatory Visit (HOSPITAL_COMMUNITY): Payer: Self-pay | Admitting: Oral and Maxillofacial Surgery

## 2013-10-03 DIAGNOSIS — R6884 Jaw pain: Secondary | ICD-10-CM

## 2013-10-08 ENCOUNTER — Ambulatory Visit (HOSPITAL_COMMUNITY)
Admission: RE | Admit: 2013-10-08 | Discharge: 2013-10-08 | Disposition: A | Discharge status: Home / Self Care | Payer: Managed Care, Other (non HMO) | Source: Ambulatory Visit | Attending: Oral and Maxillofacial Surgery | Admitting: Oral and Maxillofacial Surgery

## 2013-10-14 ENCOUNTER — Other Ambulatory Visit: Payer: Managed Care, Other (non HMO)

## 2013-10-15 ENCOUNTER — Other Ambulatory Visit: Payer: Self-pay | Admitting: Oral and Maxillofacial Surgery

## 2013-10-15 DIAGNOSIS — R6884 Jaw pain: Secondary | ICD-10-CM

## 2013-10-18 ENCOUNTER — Ambulatory Visit: Payer: Managed Care, Other (non HMO)

## 2013-11-11 ENCOUNTER — Ambulatory Visit
Admission: RE | Admit: 2013-11-11 | Discharge: 2013-11-11 | Disposition: A | Discharge status: Home / Self Care | Payer: Managed Care, Other (non HMO) | Source: Ambulatory Visit

## 2013-11-11 ENCOUNTER — Encounter (INDEPENDENT_AMBULATORY_CARE_PROVIDER_SITE_OTHER): Payer: Self-pay

## 2013-11-11 DIAGNOSIS — Z1231 Encounter for screening mammogram for malignant neoplasm of breast: Secondary | ICD-10-CM

## 2014-03-17 ENCOUNTER — Encounter (HOSPITAL_COMMUNITY): Payer: Self-pay | Admitting: Obstetrics and Gynecology

## 2014-10-20 ENCOUNTER — Other Ambulatory Visit: Payer: Self-pay

## 2014-10-20 DIAGNOSIS — Z1231 Encounter for screening mammogram for malignant neoplasm of breast: Secondary | ICD-10-CM

## 2014-11-19 ENCOUNTER — Ambulatory Visit
Admission: RE | Admit: 2014-11-19 | Discharge: 2014-11-19 | Disposition: A | Discharge status: Home / Self Care | Payer: Managed Care, Other (non HMO) | Source: Ambulatory Visit

## 2014-11-19 DIAGNOSIS — Z1231 Encounter for screening mammogram for malignant neoplasm of breast: Secondary | ICD-10-CM

## 2015-10-16 ENCOUNTER — Other Ambulatory Visit: Payer: Self-pay | Admitting: Family Medicine

## 2015-10-16 DIAGNOSIS — Z1231 Encounter for screening mammogram for malignant neoplasm of breast: Secondary | ICD-10-CM

## 2015-11-26 ENCOUNTER — Ambulatory Visit
Admission: RE | Admit: 2015-11-26 | Discharge: 2015-11-26 | Disposition: A | Discharge status: Home / Self Care | Payer: Managed Care, Other (non HMO) | Source: Ambulatory Visit | Attending: Family Medicine | Admitting: Family Medicine

## 2015-11-26 DIAGNOSIS — Z1231 Encounter for screening mammogram for malignant neoplasm of breast: Secondary | ICD-10-CM

## 2016-10-21 ENCOUNTER — Other Ambulatory Visit: Payer: Self-pay | Admitting: Family Medicine

## 2016-10-21 DIAGNOSIS — Z1231 Encounter for screening mammogram for malignant neoplasm of breast: Secondary | ICD-10-CM

## 2016-12-01 ENCOUNTER — Ambulatory Visit: Payer: Managed Care, Other (non HMO)

## 2016-12-15 ENCOUNTER — Ambulatory Visit
Admission: RE | Admit: 2016-12-15 | Discharge: 2016-12-15 | Disposition: A | Discharge status: Home / Self Care | Payer: Managed Care, Other (non HMO) | Source: Ambulatory Visit | Attending: Family Medicine | Admitting: Family Medicine

## 2016-12-15 DIAGNOSIS — Z1231 Encounter for screening mammogram for malignant neoplasm of breast: Secondary | ICD-10-CM

## 2017-11-15 ENCOUNTER — Other Ambulatory Visit: Payer: Self-pay | Admitting: Family Medicine

## 2017-11-15 DIAGNOSIS — Z1231 Encounter for screening mammogram for malignant neoplasm of breast: Secondary | ICD-10-CM

## 2017-12-19 ENCOUNTER — Ambulatory Visit: Payer: Managed Care, Other (non HMO)

## 2018-01-05 ENCOUNTER — Ambulatory Visit
Admission: RE | Admit: 2018-01-05 | Discharge: 2018-01-05 | Disposition: A | Discharge status: Home / Self Care | Payer: PRIVATE HEALTH INSURANCE | Source: Ambulatory Visit | Attending: Family Medicine | Admitting: Family Medicine

## 2018-01-05 DIAGNOSIS — Z1231 Encounter for screening mammogram for malignant neoplasm of breast: Secondary | ICD-10-CM

## 2018-07-16 IMAGING — MG DIGITAL SCREENING BILATERAL MAMMOGRAM WITH CAD
4 series · 4 of 4 positions shown · non-contrast
Comparison: Previous exam(s).

CLINICAL DATA: Screening.

EXAM:
DIGITAL SCREENING BILATERAL MAMMOGRAM WITH CAD

[L CC]
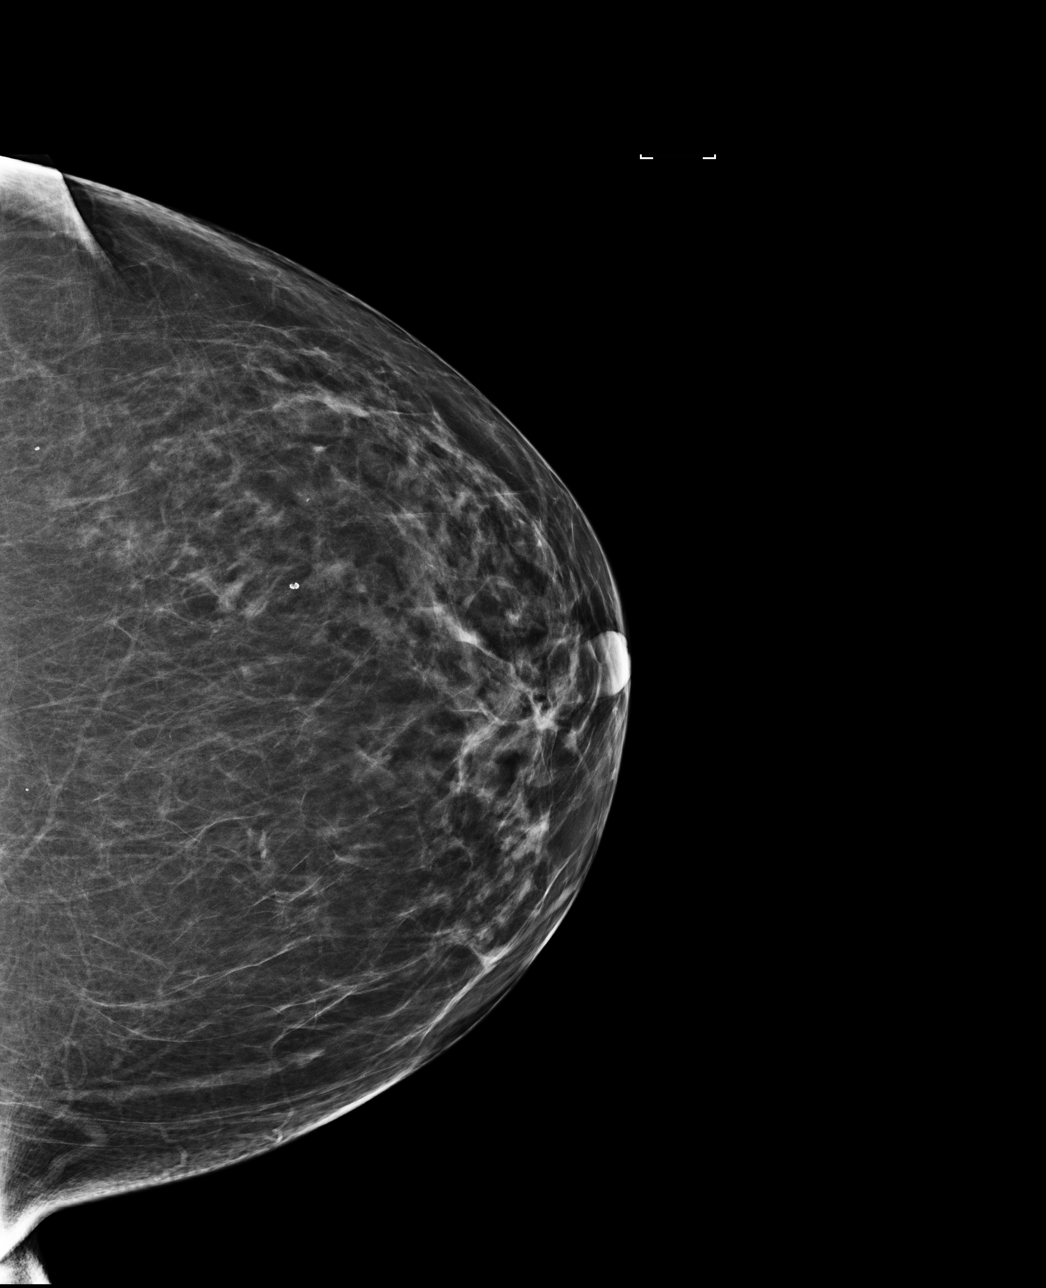

[R CC]
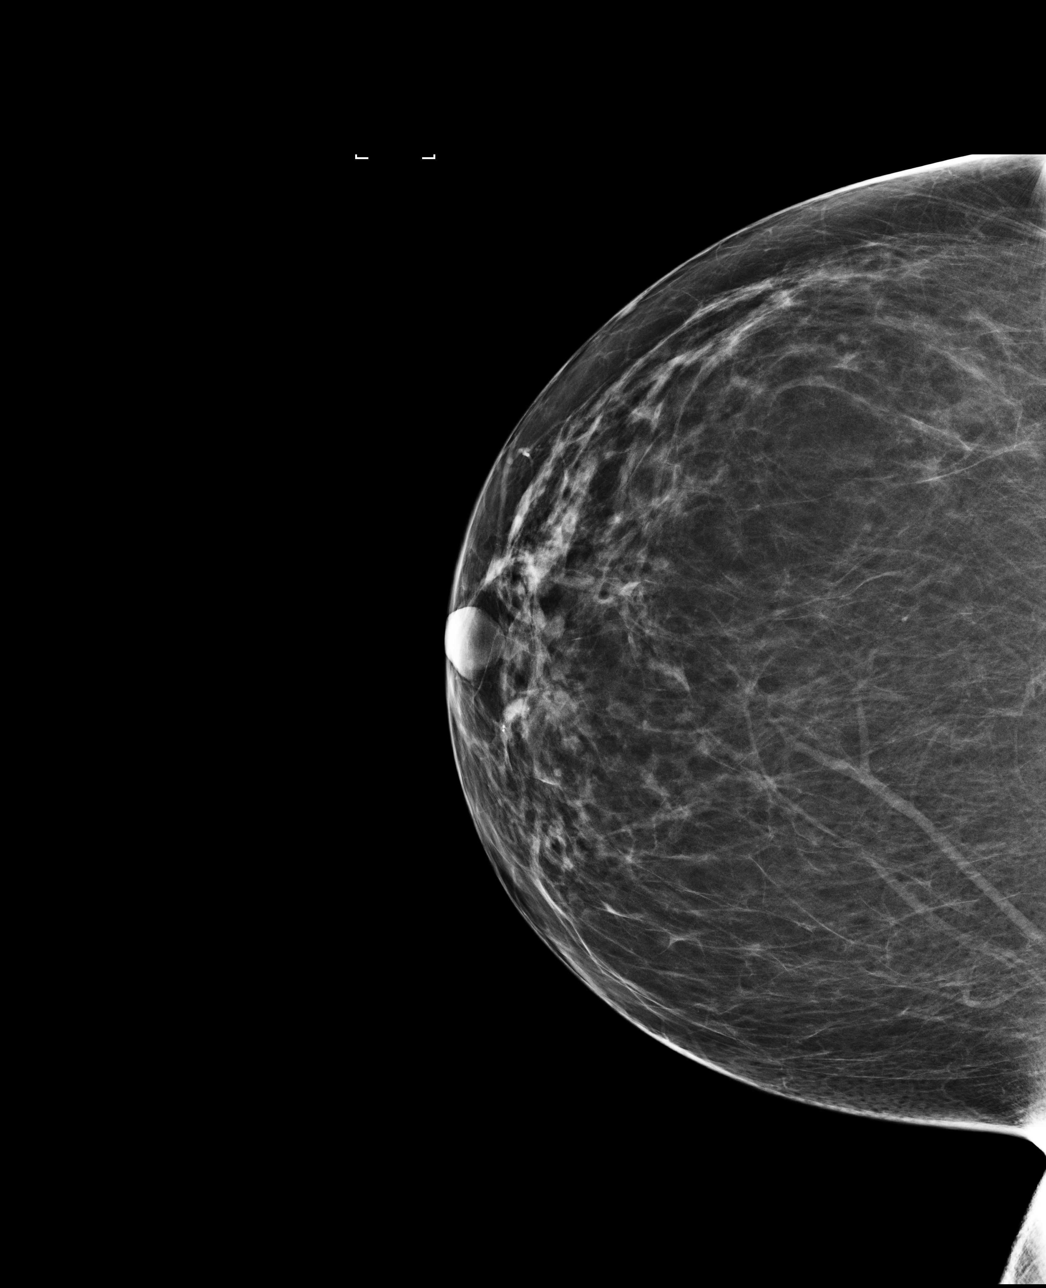

[R MLO]
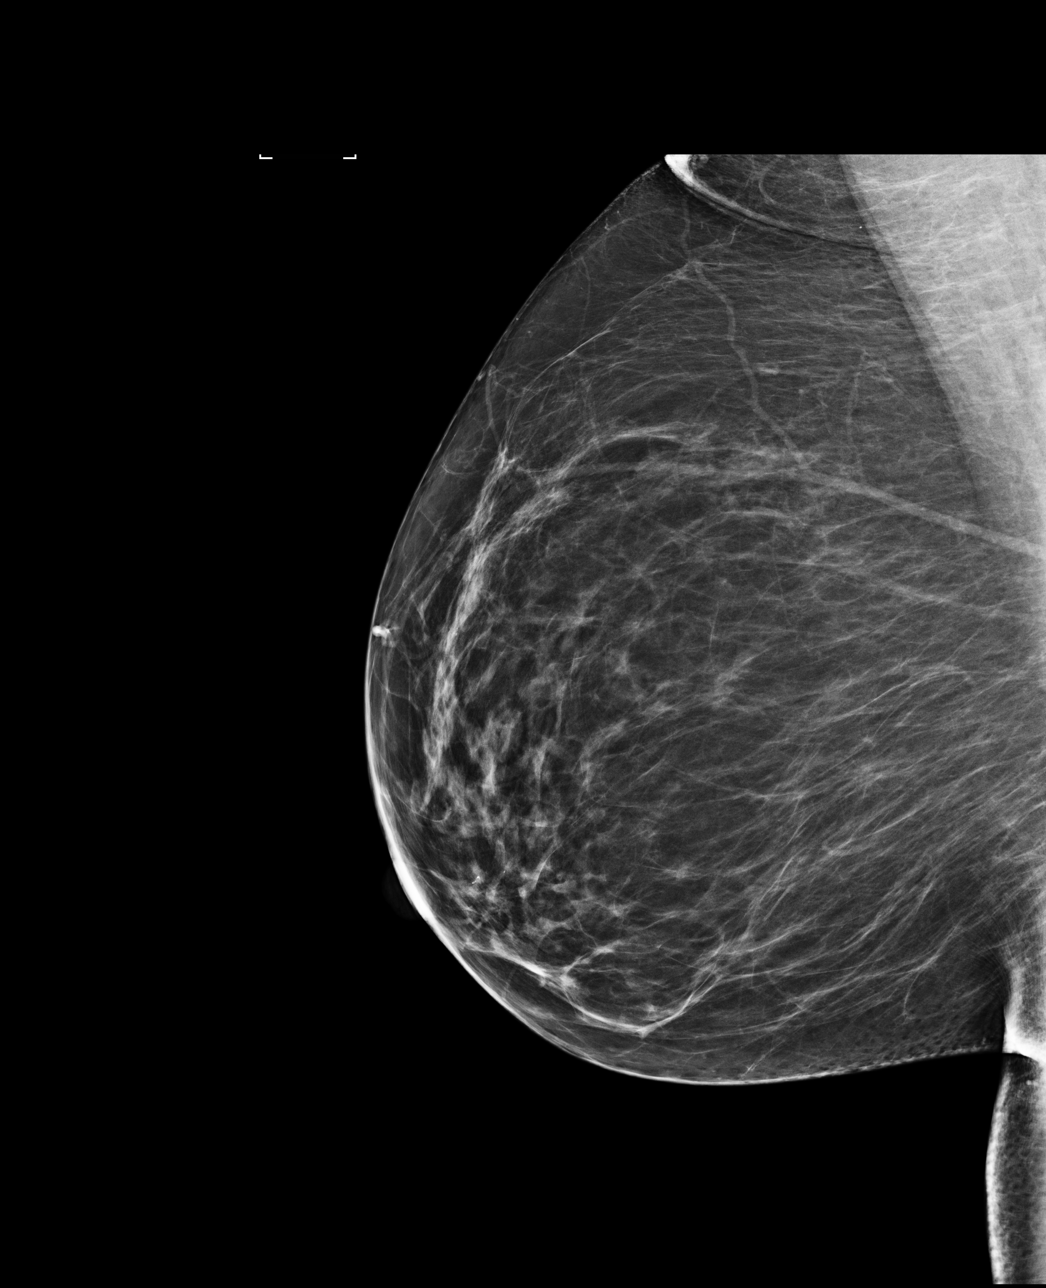

[L MLO]
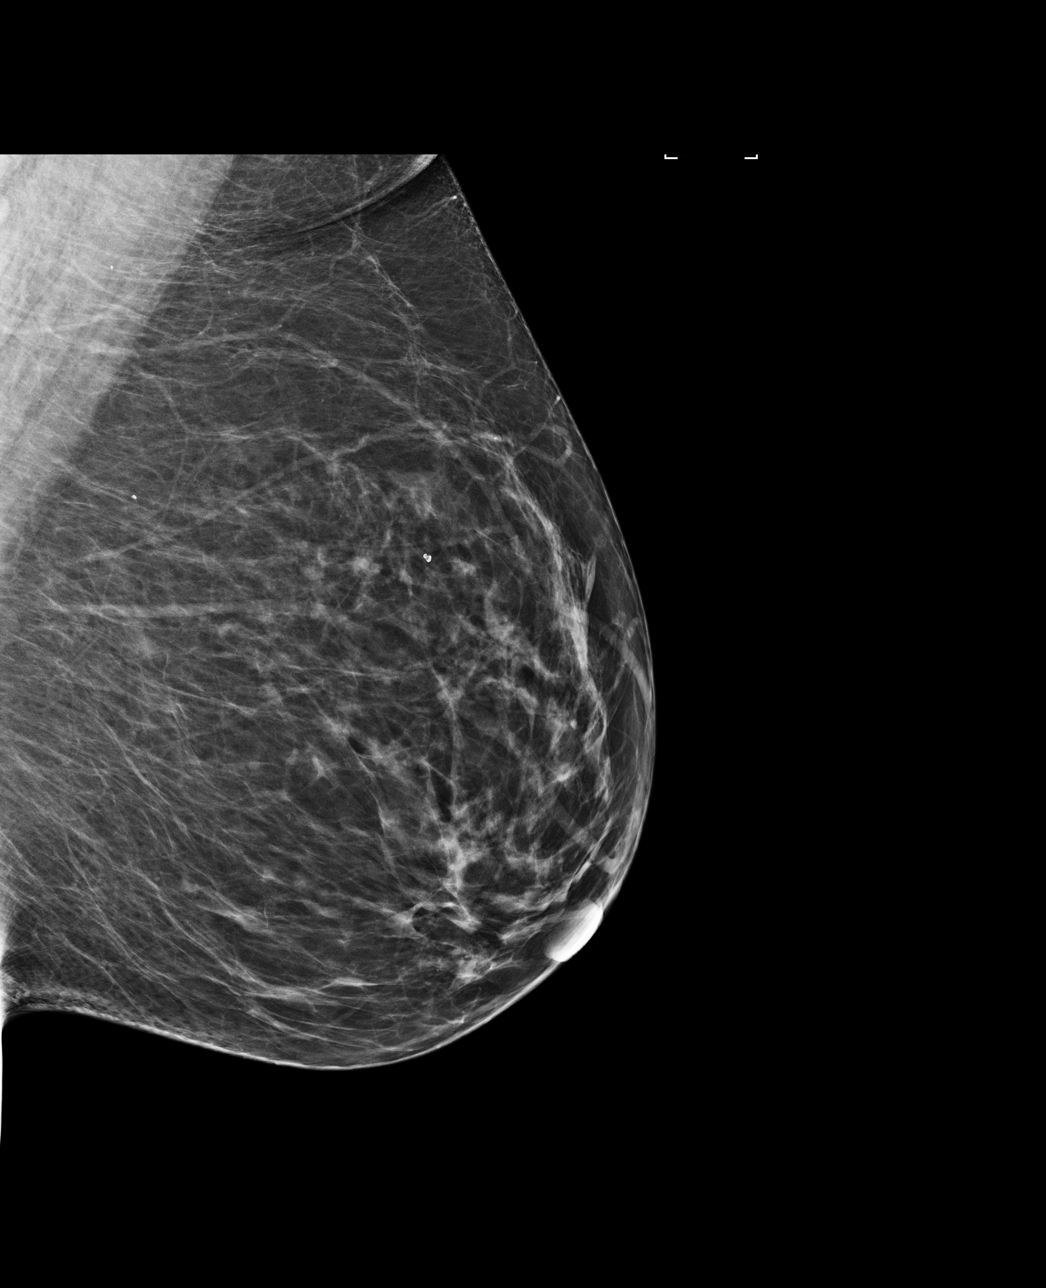

[4 of 4 positions shown; findings below may reference images not displayed]

ACR Breast Density Category b: There are scattered areas of
fibroglandular density.
FINDINGS: There are no findings suspicious for malignancy. Images were
processed with CAD.
IMPRESSION: No mammographic evidence of malignancy. A result letter of this
screening mammogram will be mailed directly to the patient.

RECOMMENDATION:
Screening mammogram in one year. (Code:AS-G-LCT)

BI-RADS CATEGORY  1: Negative.

## 2019-02-06 ENCOUNTER — Other Ambulatory Visit: Payer: Self-pay | Admitting: Family Medicine

## 2019-02-06 DIAGNOSIS — Z1231 Encounter for screening mammogram for malignant neoplasm of breast: Secondary | ICD-10-CM

## 2019-02-15 ENCOUNTER — Ambulatory Visit: Payer: PRIVATE HEALTH INSURANCE

## 2019-08-06 IMAGING — MG DIGITAL SCREENING BILATERAL MAMMOGRAM WITH CAD
4 series · 4 of 4 positions shown · non-contrast
Comparison: Previous exam(s).

CLINICAL DATA: Screening.

EXAM:
DIGITAL SCREENING BILATERAL MAMMOGRAM WITH CAD

[R CC]
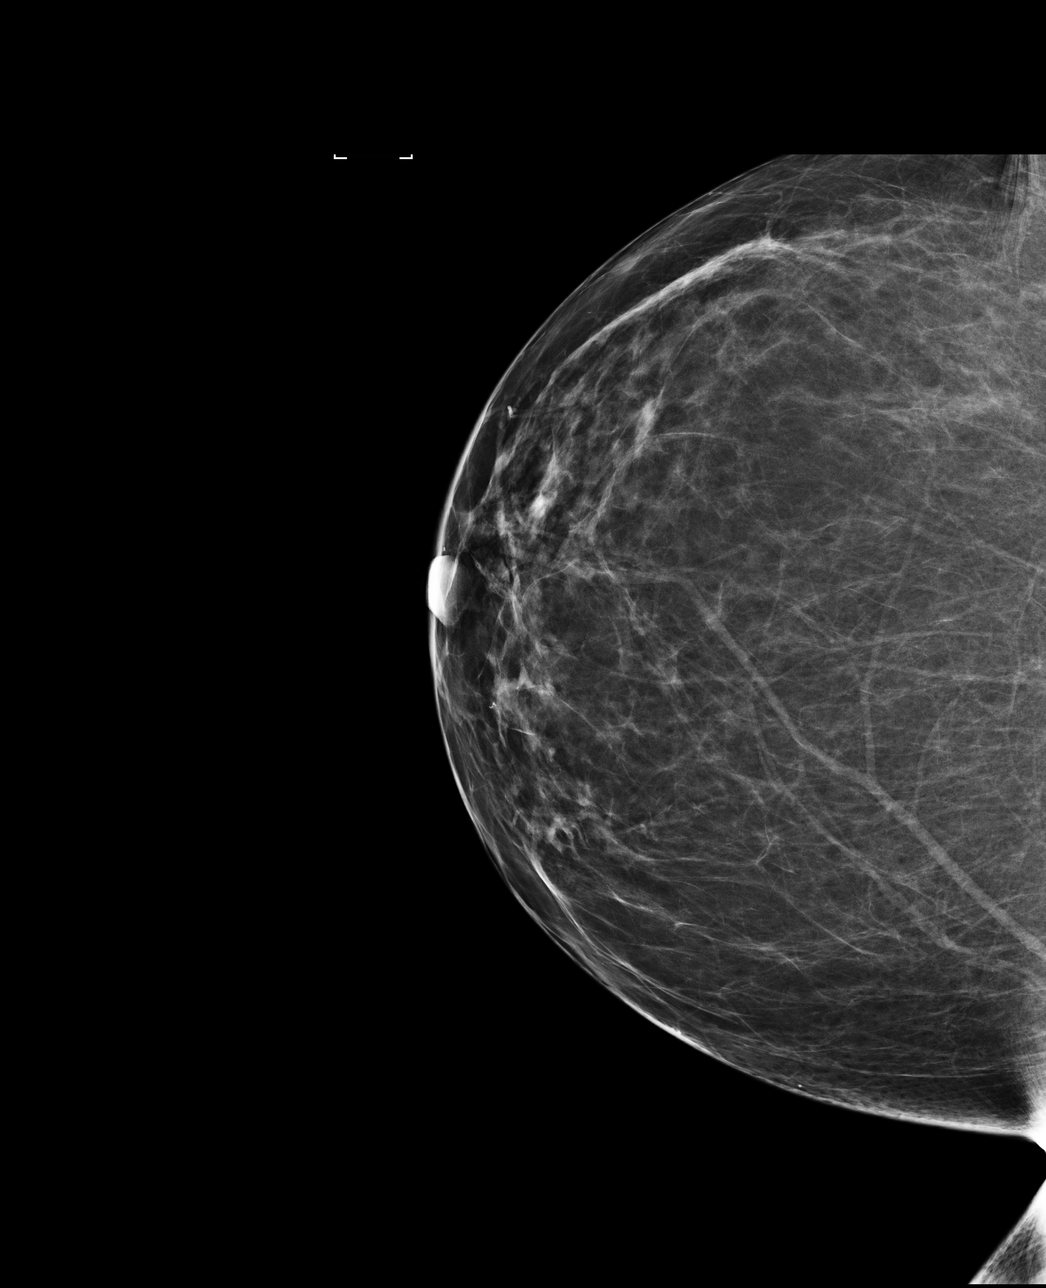

[R MLO]
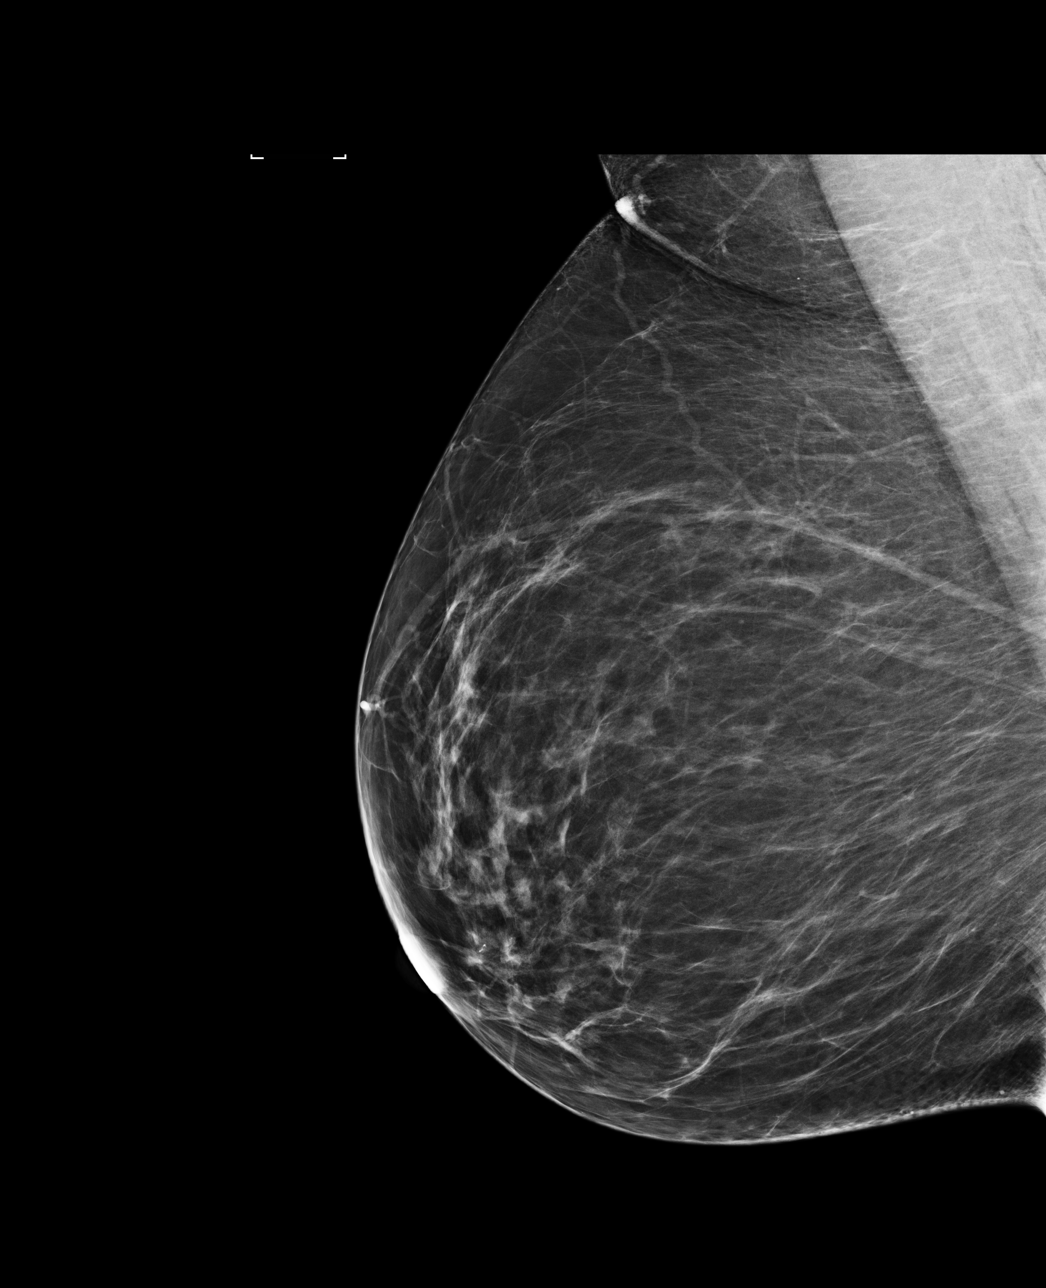

[L MLO]
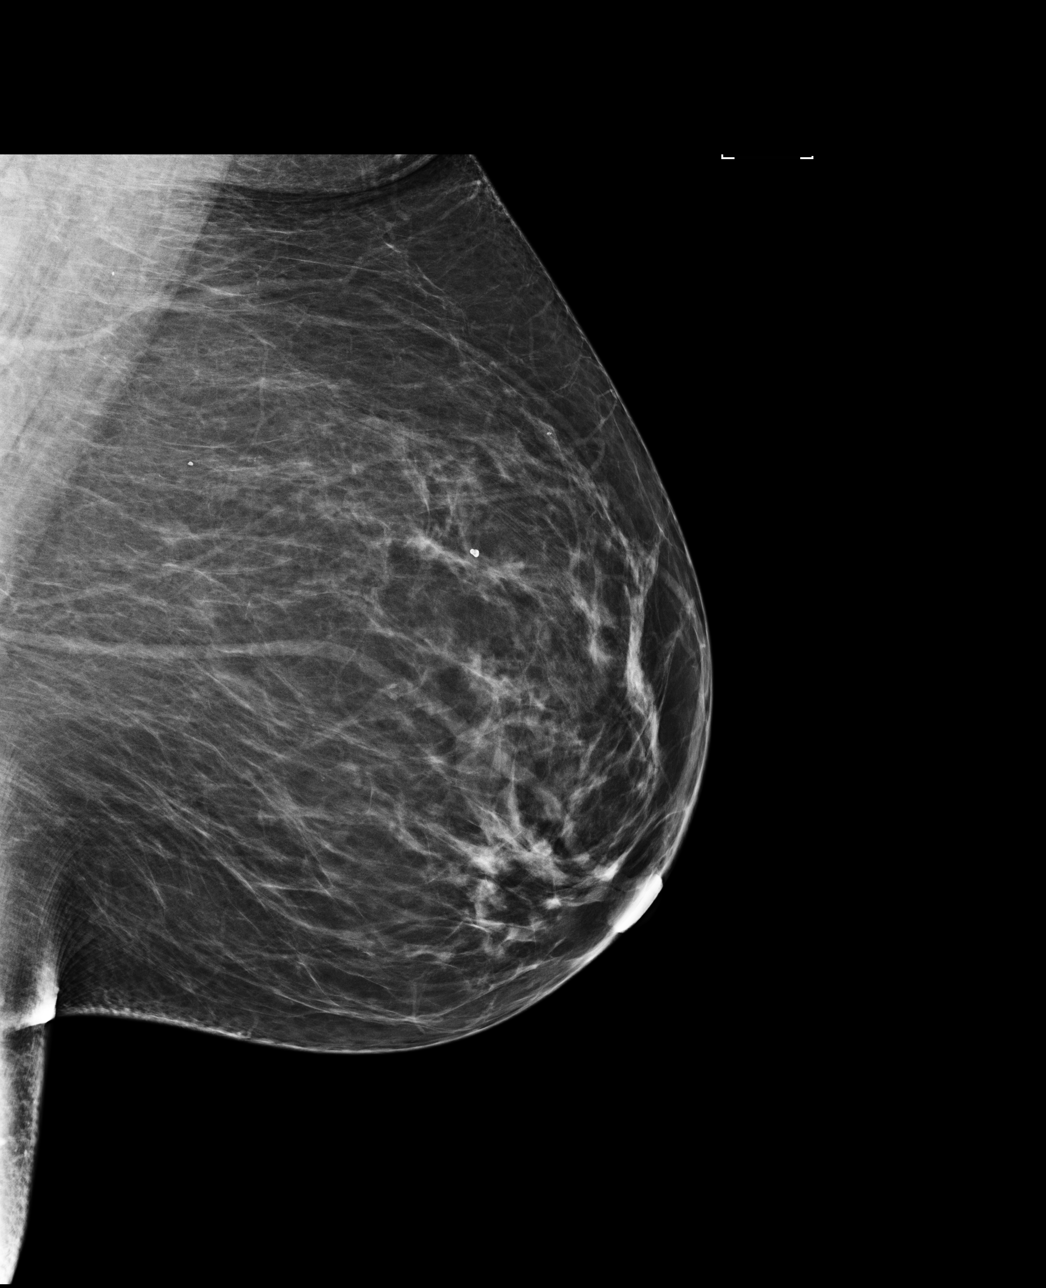

[L CC]
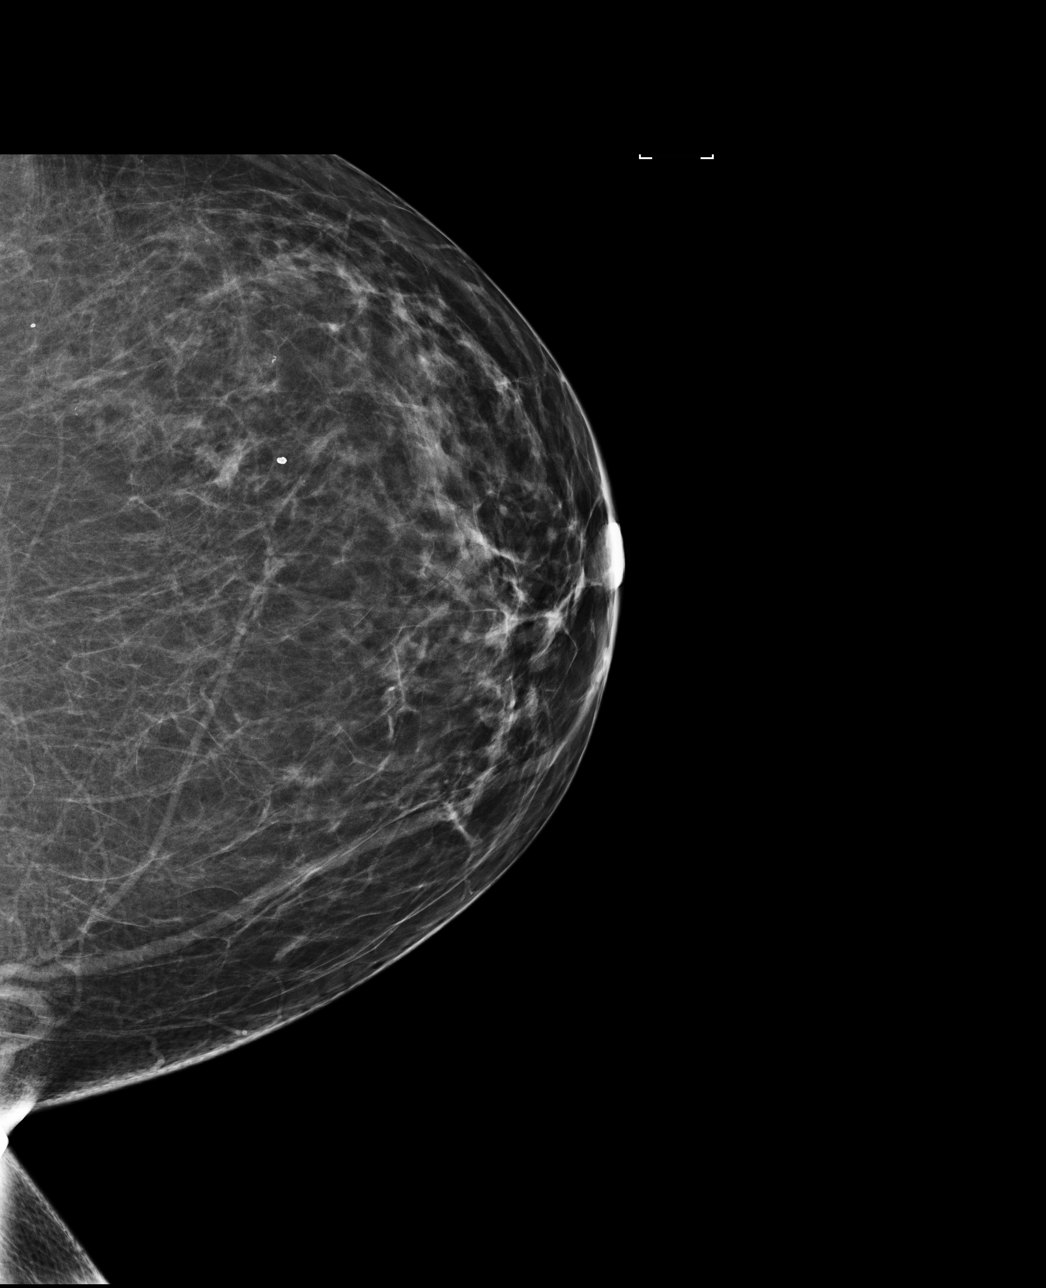

[4 of 4 positions shown; findings below may reference images not displayed]

ACR Breast Density Category b: There are scattered areas of
fibroglandular density.
FINDINGS: There are no findings suspicious for malignancy. Images were
processed with CAD.
IMPRESSION: No mammographic evidence of malignancy. A result letter of this
screening mammogram will be mailed directly to the patient.

RECOMMENDATION:
Screening mammogram in one year. (Code:AS-G-LCT)

BI-RADS CATEGORY  1: Negative.
